# Patient Record
Sex: Female | Born: 1975 | Race: Black or African American | Hispanic: No | Marital: Single | Smoking: Never smoker
Health system: Southern US, Community
[De-identification: ages and names within clinical notes are randomized; demographics above are authoritative.]

---

## 1999-07-18 ENCOUNTER — Emergency Department (HOSPITAL_COMMUNITY): Admission: EM | Admit: 1999-07-18 | Discharge: 1999-07-18 | Payer: Self-pay | Admitting: Emergency Medicine

## 2005-01-31 ENCOUNTER — Emergency Department (HOSPITAL_COMMUNITY): Admission: EM | Admit: 2005-01-31 | Discharge: 2005-01-31 | Payer: Self-pay | Admitting: Family Medicine

## 2005-02-17 ENCOUNTER — Emergency Department (HOSPITAL_COMMUNITY): Admission: EM | Admit: 2005-02-17 | Discharge: 2005-02-17 | Payer: Self-pay | Admitting: Family Medicine

## 2005-06-08 ENCOUNTER — Ambulatory Visit: Payer: Self-pay | Admitting: Nurse Practitioner

## 2005-06-22 ENCOUNTER — Ambulatory Visit: Payer: Self-pay | Admitting: Nurse Practitioner

## 2005-06-29 ENCOUNTER — Ambulatory Visit: Payer: Self-pay | Admitting: Nurse Practitioner

## 2005-08-17 ENCOUNTER — Ambulatory Visit: Payer: Self-pay | Admitting: Nurse Practitioner

## 2005-09-05 ENCOUNTER — Ambulatory Visit: Payer: Self-pay | Admitting: Nurse Practitioner

## 2005-11-11 ENCOUNTER — Emergency Department (HOSPITAL_COMMUNITY): Admission: EM | Admit: 2005-11-11 | Discharge: 2005-11-11 | Payer: Self-pay | Admitting: Family Medicine

## 2005-11-21 ENCOUNTER — Emergency Department (HOSPITAL_COMMUNITY): Admission: EM | Admit: 2005-11-21 | Discharge: 2005-11-21 | Payer: Self-pay | Admitting: Family Medicine

## 2005-12-18 ENCOUNTER — Emergency Department (HOSPITAL_COMMUNITY): Admission: EM | Admit: 2005-12-18 | Discharge: 2005-12-18 | Payer: Self-pay | Admitting: Family Medicine

## 2006-07-04 ENCOUNTER — Ambulatory Visit: Payer: Self-pay | Admitting: Family Medicine

## 2006-07-19 ENCOUNTER — Inpatient Hospital Stay (HOSPITAL_COMMUNITY): Admission: AD | Admit: 2006-07-19 | Discharge: 2006-07-19 | Payer: Self-pay | Admitting: Gynecology

## 2006-07-23 ENCOUNTER — Inpatient Hospital Stay (HOSPITAL_COMMUNITY): Admission: AD | Admit: 2006-07-23 | Discharge: 2006-07-23 | Payer: Self-pay | Admitting: Gynecology

## 2006-09-20 ENCOUNTER — Inpatient Hospital Stay (HOSPITAL_COMMUNITY): Admission: AD | Admit: 2006-09-20 | Discharge: 2006-09-20 | Payer: Self-pay | Admitting: Obstetrics and Gynecology

## 2006-09-27 ENCOUNTER — Ambulatory Visit (HOSPITAL_COMMUNITY): Admission: RE | Admit: 2006-09-27 | Discharge: 2006-09-27 | Payer: Self-pay | Admitting: Unknown Physician Specialty

## 2006-12-06 ENCOUNTER — Ambulatory Visit (HOSPITAL_COMMUNITY): Admission: RE | Admit: 2006-12-06 | Discharge: 2006-12-06 | Payer: Self-pay | Admitting: Obstetrics & Gynecology

## 2007-01-24 ENCOUNTER — Ambulatory Visit (HOSPITAL_COMMUNITY): Admission: RE | Admit: 2007-01-24 | Discharge: 2007-01-24 | Payer: Self-pay | Admitting: Obstetrics and Gynecology

## 2007-02-08 ENCOUNTER — Encounter: Payer: Self-pay | Admitting: Obstetrics & Gynecology

## 2007-02-08 ENCOUNTER — Other Ambulatory Visit: Payer: Self-pay | Admitting: Obstetrics & Gynecology

## 2007-02-08 ENCOUNTER — Ambulatory Visit: Payer: Self-pay | Admitting: Obstetrics & Gynecology

## 2007-02-08 ENCOUNTER — Inpatient Hospital Stay (HOSPITAL_COMMUNITY): Admission: RE | Admit: 2007-02-08 | Discharge: 2007-02-11 | Payer: Self-pay | Admitting: Obstetrics & Gynecology

## 2008-01-06 ENCOUNTER — Emergency Department (HOSPITAL_COMMUNITY): Admission: EM | Admit: 2008-01-06 | Discharge: 2008-01-07 | Payer: Self-pay | Admitting: Emergency Medicine

## 2010-10-12 ENCOUNTER — Emergency Department (HOSPITAL_COMMUNITY)
Admission: EM | Admit: 2010-10-12 | Discharge: 2010-10-13 | Disposition: A | Discharge status: Home / Self Care | Payer: No Typology Code available for payment source | Attending: Emergency Medicine | Admitting: Emergency Medicine

## 2010-10-12 DIAGNOSIS — Z79899 Other long term (current) drug therapy: Secondary | ICD-10-CM | POA: Insufficient documentation

## 2010-10-12 DIAGNOSIS — R51 Headache: Secondary | ICD-10-CM | POA: Insufficient documentation

## 2010-10-12 DIAGNOSIS — M549 Dorsalgia, unspecified: Secondary | ICD-10-CM | POA: Insufficient documentation

## 2010-10-12 NOTE — Discharge Summary (Signed)
Holly Stanton, Holly Stanton               ACCOUNT NO.:  [id number]   MEDICAL RECORD NO.:  [id number]          PATIENT TYPE:  INP   LOCATION:  9103                          FACILITY:  [facility]   PHYSICIAN:  Allie Bossier, MD        DATE OF BIRTH:  1976-03-03   DATE OF ADMISSION:  02/08/2007  DATE OF DISCHARGE:  02/11/2007                               DISCHARGE SUMMARY   ADMISSION DIAGNOSES:  1. Intrauterine pregnancy at 37 weeks.  2. Vasa previa.   DISCHARGE DIAGNOSES:  1. Postoperative day #3 diagnosed repeat low transverse cesarean      section secondary to vasa previa with delivery of viable female      infant.  2. Acute blood loss anemia.   DISCHARGE MEDICATIONS:  1. Prenatal vitamins 1 p.o. daily while breast-feeding.  2. Iron sulfate 325 mg 1 tab p.o. daily.  3. Colace 100 mg 1 tab p.o. b.i.d. for constipation.  4. Percocet 5/325 mg 1-2 tabs p.o. q.4-6h. p.r.n. pain.  5. Micronor 0.35 mg 1 tab p.o. daily at the same time every day.   PROCEDURE:  Repeat low transverse cesarean section by Dr. Elsie Lincoln  on February 08, 2007.   PRENATAL LABS:  Blood type A positive, antibody negative, RPR  nonreactive, rubella immune, hepatitis B surface antigen negative, HIV  nonreactive, GBS unknown.   ALLERGIES:  AMPICILLIN CAUSES RASH AND CIPROFLOXACIN WHICH CAUSES A  RASH.   HOSPITAL COURSE:  The patient is a 34 year old G7, P3-1-3-4 who  presented at 37 weeks for a scheduled cesarean section repeat secondary  to vasa previa.  The surgery was conducted by Dr. Elsie Lincoln on  February 08, 2007.  A viable female infant was delivered.  There was  some mild uterine atony which resolved with Methergine, carboprost and  Cytotec.  The patient did have some postoperative anemia with a  hemoglobin of 7.9 on September 12 and 8.0 on September 13.  Otherwise,  the patient's postoperative period was uneventful.  She had an estimated  blood loss of 1200 mL.  On day of discharge, the patient was  without  complaint with some appropriate abdominal tenderness which was  controlled with Percocet.  The patient plans to breast and bottle-feed,  and the patient is using Micronor for birth control.  On postoperative  day #3, the patient was deemed stable for discharge.  Her staples were  to be removed by nursing prior to discharge.   DISCHARGE INSTRUCTIONS:  1. The patient is to follow up at the [facility] in 6 weeks.  2. The patient is to have nothing per vagina x6 weeks.  3. The patient not to do any heavy lifting x6 weeks.  4. The patient is to take all medications as prescribed.   FOLLOWUP ISSUES:  Anemia.   DISPOSITION:  The patient is discharged to home in stable condition.      Lauro Franklin, MD      Allie Bossier, MD  Electronically Signed    TCB/MEDQ  D:  02/11/2007  T:  02/11/2007  Job:  [id number]

## 2010-10-12 NOTE — Op Note (Signed)
Holly Stanton, Holly Stanton               ACCOUNT NO.:  [id number]   MEDICAL RECORD NO.:  [id number]          PATIENT TYPE:  INP   LOCATION:  9103                          FACILITY:  [facility]   PHYSICIAN:  Lesly Dukes, M.D. DATE OF BIRTH:  September 10, 1975   DATE OF PROCEDURE:  02/08/2007  DATE OF DISCHARGE:                               OPERATIVE REPORT   PREOPERATIVE DIAGNOSIS:  A 35 year old G7, para 2-1-3-4 at 37 weeks with  marginal previa.   POSTOPERATIVE DIAGNOSIS:  A 35 year old G7, para 2-1-3-4 at 37 weeks  with marginal previa.   OPERATION PERFORMED:  Repeat low transverse cesarean section.   SURGEON:  Lesly Dukes, M.D.   ASSISTANT:  Ginger Carne, MD   ANESTHESIA:  Spinal.   FINDINGS:  Viable female infant.  Mild uterine atony, resolved with  Methergine, Carboprost and Cytotec.   ESTIMATED BLOOD LOSS:  1200.   SPECIMENS:  Placenta to pathology.   COMPLICATIONS:  None.   DESCRIPTION OF PROCEDURE:  After informed consent was obtained, the  patient was taken to the operating room where spinal anesthesia was  found to be adequate.  The patient was placed in dorsal supine position  with a leftward tilt and prepped and draped in the normal sterile  fashion.  A Foley catheter was in the bladder.  Pfannenstiel skin  incision was made with the scalpel and carried down to fascia.  The  fascia was incised in the midline extended bilaterally.  The superior  and inferior aspects of the fascial incision were grasped with Kocher  clamps, tented up, and dissected off sharply and bluntly from underlying  layers of rectus muscle.  The rectus muscles were separated in the  midline.  The peritoneum was entered sharply.  This incision was  extended both superior and inferiorly with good visualization of the  bladder.  The bladder blade was inserted.  The bladder flap was created  and the bladder blade was then reinserted.  The uterus was incised in a  transverse fashion in the lower  uterine segment and extended bilaterally  bluntly.  The baby was breech and delivered easily.  The nose and mouth  were suctioned and the cord clamped and cut and the baby was handed off  to the waiting pediatrician.  The placenta delivered manually.  There  was atony.  Double strength Pitocin was given, Methergine was given IM,  Carboprost was given IM and 1000 mcg of Cytotec was given per rectum.  The uterine incision was closed with 0 Monocryl in a running locked  fashion.  A second suture of 0 Monocryl was used to reinforce the  incision and aid hemostasis.  The uterus was contracted well at the end  of uterine closure.  There were copious filmy adhesions on the back of  the uterus between the fallopian tubes, omentum and uterus.  These were  taken down.  The uterus again was noted to be hemostatic off tension.  The peritoneum, bladder reflection and rectus muscles were noted to be  hemostatic.  Fascia was closed with 0 Vicryl in a  running  fashion.  Good hemostasis was noted.  The subcutaneous tissue  was copiously irrigated and found to be hemostatic.  The skin was closed  with staples.  The patient tolerated the procedure well.  Sponge, lap,  needle and instruments were correct times two.  Patient went to recovery  room in stable condition.      Lesly Dukes, M.D.  Electronically Signed     KHL/MEDQ  D:  02/08/2007  T:  02/09/2007  Job:  [id number]

## 2011-03-10 ENCOUNTER — Other Ambulatory Visit: Payer: Self-pay

## 2011-03-11 LAB — CBC
HCT: 23 — ABNORMAL LOW
HCT: 23.1 — ABNORMAL LOW
HCT: 29.6 — ABNORMAL LOW
MCHC: 34
MCHC: 34.7
MCV: 94.4
Platelets: 250
Platelets: 261
RBC: 2.49 — ABNORMAL LOW
RDW: 13.4
RDW: 14
WBC: 10.3
WBC: 11.2 — ABNORMAL HIGH

## 2011-03-11 LAB — CROSSMATCH: Antibody Screen: NEGATIVE

## 2012-03-23 ENCOUNTER — Other Ambulatory Visit: Payer: Self-pay | Admitting: Internal Medicine

## 2014-04-01 ENCOUNTER — Other Ambulatory Visit: Payer: Self-pay

## 2014-04-01 DIAGNOSIS — Z1231 Encounter for screening mammogram for malignant neoplasm of breast: Secondary | ICD-10-CM

## 2014-04-07 ENCOUNTER — Ambulatory Visit
Admission: RE | Admit: 2014-04-07 | Discharge: 2014-04-07 | Disposition: A | Discharge status: Home / Self Care | Payer: BC Managed Care – PPO | Source: Ambulatory Visit

## 2014-04-07 DIAGNOSIS — Z1231 Encounter for screening mammogram for malignant neoplasm of breast: Secondary | ICD-10-CM

## 2014-10-31 ENCOUNTER — Ambulatory Visit
Admission: RE | Admit: 2014-10-31 | Discharge: 2014-10-31 | Disposition: A | Discharge status: Home / Self Care | Payer: BLUE CROSS/BLUE SHIELD | Source: Ambulatory Visit | Attending: Family | Admitting: Family

## 2014-10-31 ENCOUNTER — Other Ambulatory Visit: Payer: Self-pay | Admitting: Family

## 2014-10-31 DIAGNOSIS — M25562 Pain in left knee: Secondary | ICD-10-CM

## 2015-04-08 ENCOUNTER — Other Ambulatory Visit: Payer: Self-pay

## 2015-04-13 LAB — CYTOLOGY - PAP

## 2015-05-12 ENCOUNTER — Other Ambulatory Visit: Payer: Self-pay

## 2015-05-12 DIAGNOSIS — Z1231 Encounter for screening mammogram for malignant neoplasm of breast: Secondary | ICD-10-CM

## 2015-06-03 ENCOUNTER — Ambulatory Visit
Admission: RE | Admit: 2015-06-03 | Discharge: 2015-06-03 | Disposition: A | Discharge status: Home / Self Care | Payer: BLUE CROSS/BLUE SHIELD | Source: Ambulatory Visit

## 2015-06-03 DIAGNOSIS — Z1231 Encounter for screening mammogram for malignant neoplasm of breast: Secondary | ICD-10-CM

## 2017-04-12 ENCOUNTER — Other Ambulatory Visit: Payer: Self-pay | Admitting: Nurse Practitioner

## 2017-04-12 DIAGNOSIS — Z1231 Encounter for screening mammogram for malignant neoplasm of breast: Secondary | ICD-10-CM

## 2017-05-12 ENCOUNTER — Ambulatory Visit
Admission: RE | Admit: 2017-05-12 | Discharge: 2017-05-12 | Disposition: A | Discharge status: Home / Self Care | Payer: BLUE CROSS/BLUE SHIELD | Source: Ambulatory Visit | Attending: Nurse Practitioner | Admitting: Nurse Practitioner

## 2017-05-12 DIAGNOSIS — Z1231 Encounter for screening mammogram for malignant neoplasm of breast: Secondary | ICD-10-CM

## 2018-04-06 ENCOUNTER — Other Ambulatory Visit: Payer: Self-pay | Admitting: Nurse Practitioner

## 2018-04-06 DIAGNOSIS — Z1231 Encounter for screening mammogram for malignant neoplasm of breast: Secondary | ICD-10-CM

## 2018-04-25 ENCOUNTER — Encounter: Payer: Self-pay | Admitting: Nurse Practitioner

## 2018-04-25 ENCOUNTER — Ambulatory Visit (INDEPENDENT_AMBULATORY_CARE_PROVIDER_SITE_OTHER): Payer: Managed Care, Other (non HMO) | Admitting: Nurse Practitioner

## 2018-04-25 ENCOUNTER — Other Ambulatory Visit (HOSPITAL_COMMUNITY)
Admission: RE | Admit: 2018-04-25 | Discharge: 2018-04-25 | Disposition: A | Discharge status: Home / Self Care | Payer: Managed Care, Other (non HMO) | Source: Ambulatory Visit | Attending: Nurse Practitioner | Admitting: Nurse Practitioner

## 2018-04-25 VITALS — BP 122/84 | HR 67 | Temp 98.3°F | Ht 61.0 in | Wt 155.2 lb

## 2018-04-25 DIAGNOSIS — Z3041 Encounter for surveillance of contraceptive pills: Secondary | ICD-10-CM

## 2018-04-25 DIAGNOSIS — Z113 Encounter for screening for infections with a predominantly sexual mode of transmission: Secondary | ICD-10-CM | POA: Diagnosis present

## 2018-04-25 DIAGNOSIS — Z0000 Encounter for general adult medical examination without abnormal findings: Secondary | ICD-10-CM | POA: Diagnosis not present

## 2018-04-25 DIAGNOSIS — Z124 Encounter for screening for malignant neoplasm of cervix: Secondary | ICD-10-CM | POA: Diagnosis not present

## 2018-04-25 DIAGNOSIS — R319 Hematuria, unspecified: Secondary | ICD-10-CM | POA: Diagnosis not present

## 2018-04-25 DIAGNOSIS — N898 Other specified noninflammatory disorders of vagina: Secondary | ICD-10-CM

## 2018-04-25 LAB — POCT URINALYSIS DIPSTICK
Glucose, UA: NEGATIVE
Nitrite, UA: NEGATIVE
Protein, UA: POSITIVE — AB
Spec Grav, UA: 1.025 (ref 1.010–1.025)
Urobilinogen, UA: 0.2 U/dL
pH, UA: 6 (ref 5.0–8.0)

## 2018-04-25 MED ORDER — NORGESTIMATE-ETH ESTRADIOL 0.25-35 MG-MCG PO TABS: 1.0000 | ORAL_TABLET | Freq: Every day | ORAL | 3 refills | Status: DC

## 2018-04-25 MED ORDER — METRONIDAZOLE 0.75 % VA GEL: 1.0000 | Freq: Two times a day (BID) | VAGINAL | 0 refills | Status: DC

## 2018-04-25 NOTE — Progress Notes (Signed)
Subjective:     Patient ID: Holly Stanton , female    DOB: 06-07-1975 , 42 y.o.   MRN: [id number]   Chief Complaint  Patient presents with  . Annual Exam   The patient states she uses OCP (estrogen/progesterone) for birth control. Last LMP was Patient's last menstrual period was 04/04/2018.. Negative for Dysmenorrhea and Negative for Menorrhagia Mammogram last done 05/12/17.  Negative for: breast discharge, breast lump(s), breast pain and breast self exam.  Pertinent negatives include abnormal bleeding (hematology), anxiety, decreased libido, depression, difficulty falling sleep, dyspareunia, history of infertility, nocturia, sexual dysfunction, sleep disturbances, urinary incontinence, urinary urgency, vaginal discharge and vaginal itching. Diet regular.The patient states her exercise level is  5 days, cardio and strength training   . The patient's tobacco use is:  Social History   Tobacco Use  Smoking Status Never Smoker  Smokeless Tobacco Never Used  . She has been exposed to passive smoke. The patient's alcohol use is:  Social History   Substance and Sexual Activity  Alcohol Use Yes   Comment: OCCASSIONALY  . Additional information: Last pap 04/19/17 , next one scheduled for patient requests today.   HPI  Here for health maintenance     No past medical history on file.   Family History  Problem Relation Age of Onset  . Breast cancer Neg Hx      Current Outpatient Medications:  .  norgestimate-ethinyl estradiol (ORTHO-CYCLEN,SPRINTEC,PREVIFEM) 0.25-35 MG-MCG tablet, Take 1 tablet by mouth daily., Disp: , Rfl:    Allergies  Allergen Reactions  . Ampicillin Rash  . Ciprofloxacin Hcl Rash     Review of Systems  Constitutional: Negative.   HENT: Negative.   Eyes: Negative.   Respiratory: Negative.   Cardiovascular: Negative.   Gastrointestinal: Negative.   Endocrine: Negative.   Genitourinary: Negative.   Musculoskeletal: Negative.   Skin: Negative.    Allergic/Immunologic: Negative.   Neurological: Negative.   Hematological: Negative.   Psychiatric/Behavioral: Negative.      Today's Vitals   04/25/18 0918  BP: 122/84  Pulse: 67  Temp: 98.3 F (36.8 C)  TempSrc: Oral  SpO2: 98%  Weight: 155 lb 3.2 oz (70.4 kg)  Height: 5\' 1"  (1.549 m)  PainSc: 0-No pain   Body mass index is 29.32 kg/m.   Objective:  Physical Exam  Constitutional: She is oriented to person, place, and time. Vital signs are normal. She appears well-developed and well-nourished.  HENT:  Head: Normocephalic and atraumatic.  Right Ear: Hearing, tympanic membrane, external ear and ear canal normal.  Left Ear: Hearing, tympanic membrane, external ear and ear canal normal.  Nose: Nose normal.  Mouth/Throat: Oropharynx is clear and moist.  Eyes: Pupils are equal, round, and reactive to light. Conjunctivae, EOM and lids are normal.  Fundoscopic exam:      The right eye shows no papilledema.       The left eye shows no papilledema.  Neck: Normal range of motion and full passive range of motion without pain. Neck supple. Carotid bruit is not present. No thyroid mass present.  Cardiovascular: Normal rate, regular rhythm, normal heart sounds, intact distal pulses and normal pulses.  No murmur heard. Pulmonary/Chest: Effort normal and breath sounds normal.  Abdominal: Soft. Bowel sounds are normal.  Musculoskeletal: Normal range of motion.  Neurological: She is alert and oriented to person, place, and time. She has normal strength. No cranial nerve deficit or sensory deficit.  Skin: Skin is warm and dry. Capillary refill takes less  than 2 seconds.  Psychiatric: She has a normal mood and affect. Her behavior is normal. Judgment and thought content normal.  Vitals reviewed.       Assessment And Plan:     1. Encounter for general adult medical examination w/o abnormal findings . Behavior modifications discussed and diet history reviewed. . Pt will continue to  exercise regularly and modify diet with low GI, plant based foods and decrease intake of processed foods.  . Recommend intake of daily multivitamin, Vitamin D, and calcium.  . Recommend mammogram and colonoscopy for preventive screenings, as well as recommend immunizations that include influenza, TDAP - POCT Urinalysis Dipstick (81002) - CBC - CMP14 + Anion Gap - Hemoglobin A1c - Lipid panel - Vitamin D (25 hydroxy)  2. Encounter for birth control pills maintenance  - norgestimate-ethinyl estradiol (ORTHO-CYCLEN,SPRINTEC,PREVIFEM) 0.25-35 MG-MCG tablet; Take 1 tablet by mouth daily.  Dispense: 3 Package; Refill: 3  3. Encounter for Papanicolaou smear of cervix   4. Encounter for screening examination for sexually transmitted disease  White discharge noted on pelvic exam - Cytology -Pap Smear - Cytology (oral, anal, urethral) ancillary only - HIV antibody (with reflex) - T pallidum Screening [place]  5. Hematuria, unspecified type  Will have her to recheck urine in a few weeks, encouraged to increase her water intake.  - Culture, Urine  6. Vaginal discharge  White discharge noted, will treat in the event this is bacterial vaginosis, will also send a swab for STD.   - metroNIDAZOLE (METROGEL VAGINAL) 0.75 % vaginal gel; Place 1 Applicatorful vaginally 2 (two) times daily.  Dispense: 70 g; Refill: 0   Arnette Felts, FNP

## 2018-04-25 NOTE — Patient Instructions (Signed)
Hematuria, Adult Hematuria is blood in your urine. It can be caused by a bladder infection, kidney infection, prostate infection, kidney stone, or cancer of your urinary tract. Infections can usually be treated with medicine, and a kidney stone usually will pass through your urine. If neither of these is the cause of your hematuria, further workup to find out the reason may be needed. It is very important that you tell your health care provider about any blood you see in your urine, even if the blood stops without treatment or happens without causing pain. Blood in your urine that happens and then stops and then happens again can be a symptom of a very serious condition. Also, pain is not a symptom in the initial stages of many urinary cancers. Follow these instructions at home:  Drink lots of fluid, 3-4 quarts a day. If you have been diagnosed with an infection, cranberry juice is especially recommended, in addition to large amounts of water.  Avoid caffeine, tea, and carbonated beverages because they tend to irritate the bladder.  Avoid alcohol because it may irritate the prostate.  Take all medicines as directed by your health care provider.  If you were prescribed an antibiotic medicine, finish it all even if you start to feel better.  If you have been diagnosed with a kidney stone, follow your health care provider's instructions regarding straining your urine to catch the stone.  Empty your bladder often. Avoid holding urine for long periods of time.  After a bowel movement, women should cleanse front to back. Use each tissue only once.  Empty your bladder before and after sexual intercourse if you are a female. Contact a health care provider if:  You develop back pain.  You have a fever.  You have a feeling of sickness in your stomach (nausea) or vomiting.  Your symptoms are not better in 3 days. Return sooner if you are getting worse. Get help right away if:  You develop  severe vomiting and are unable to keep the medicine down.  You develop severe back or abdominal pain despite taking your medicines.  You begin passing a large amount of blood or clots in your urine.  You feel extremely weak or faint, or you pass out. This information is not intended to replace advice given to you by your health care provider. Make sure you discuss any questions you have with your health care provider. Document Released: 05/16/2005 Document Revised: 10/22/2015 Document Reviewed: 01/14/2013 Elsevier Interactive Patient Education  2017 [place] Maintenance, Female Adopting a healthy lifestyle and getting preventive care can go a long way to promote health and wellness. Talk with your health care provider about what schedule of regular examinations is right for you. This is a good chance for you to check in with your provider about disease prevention and staying healthy. In between checkups, there are plenty of things you can do on your own. Experts have done a lot of research about which lifestyle changes and preventive measures are most likely to keep you healthy. Ask your health care provider for more information. Weight and diet Eat a healthy diet  Be sure to include plenty of vegetables, fruits, low-fat dairy products, and lean protein.  Do not eat a lot of foods high in solid fats, added sugars, or salt.  Get regular exercise. This is one of the most important things you can do for your health. ? Most adults should exercise for at least 150 minutes each week. The  exercise should increase your heart rate and make you sweat (moderate-intensity exercise). ? Most adults should also do strengthening exercises at least twice a week. This is in addition to the moderate-intensity exercise.  Maintain a healthy weight  Body mass index (BMI) is a measurement that can be used to identify possible weight problems. It estimates body fat based on height and weight. Your  health care provider can help determine your BMI and help you achieve or maintain a healthy weight.  For females 95 years of age and older: ? A BMI below 18.5 is considered underweight. ? A BMI of 18.5 to 24.9 is normal. ? A BMI of 25 to 29.9 is considered overweight. ? A BMI of 30 and above is considered obese.  Watch levels of cholesterol and blood lipids  You should start having your blood tested for lipids and cholesterol at 44 years of age, then have this test every 5 years.  You may need to have your cholesterol levels checked more often if: ? Your lipid or cholesterol levels are high. ? You are older than 44 years of age. ? You are at high risk for heart disease.  Cancer screening Lung Cancer  Lung cancer screening is recommended for adults 53-60 years old who are at high risk for lung cancer because of a history of smoking.  A yearly low-dose CT scan of the lungs is recommended for people who: ? Currently smoke. ? Have quit within the past 15 years. ? Have at least a 30-pack-year history of smoking. A pack year is smoking an average of one pack of cigarettes a day for 1 year.  Yearly screening should continue until it has been 15 years since you quit.  Yearly screening should stop if you develop a health problem that would prevent you from having lung cancer treatment.  Breast Cancer  Practice breast self-awareness. This means understanding how your breasts normally appear and feel.  It also means doing regular breast self-exams. Let your health care provider know about any changes, no matter how small.  If you are in your 20s or 30s, you should have a clinical breast exam (CBE) by a health care provider every 1-3 years as part of a regular health exam.  If you are 82 or older, have a CBE every year. Also consider having a breast X-ray (mammogram) every year.  If you have a family history of breast cancer, talk to your health care provider about genetic  screening.  If you are at high risk for breast cancer, talk to your health care provider about having an MRI and a mammogram every year.  Breast cancer gene (BRCA) assessment is recommended for women who have family members with BRCA-related cancers. BRCA-related cancers include: ? Breast. ? Ovarian. ? Tubal. ? Peritoneal cancers.  Results of the assessment will determine the need for genetic counseling and BRCA1 and BRCA2 testing.  Cervical Cancer Your health care provider may recommend that you be screened regularly for cancer of the pelvic organs (ovaries, uterus, and vagina). This screening involves a pelvic examination, including checking for microscopic changes to the surface of your cervix (Pap test). You may be encouraged to have this screening done every 3 years, beginning at age 73.  For women ages 22-65, health care providers may recommend pelvic exams and Pap testing every 3 years, or they may recommend the Pap and pelvic exam, combined with testing for human papilloma virus (HPV), every 5 years. Some types of HPV increase your  risk of cervical cancer. Testing for HPV may also be done on women of any age with unclear Pap test results.  Other health care providers may not recommend any screening for nonpregnant women who are considered low risk for pelvic cancer and who do not have symptoms. Ask your health care provider if a screening pelvic exam is right for you.  If you have had past treatment for cervical cancer or a condition that could lead to cancer, you need Pap tests and screening for cancer for at least 20 years after your treatment. If Pap tests have been discontinued, your risk factors (such as having a new sexual partner) need to be reassessed to determine if screening should resume. Some women have medical problems that increase the chance of getting cervical cancer. In these cases, your health care provider may recommend more frequent screening and Pap  tests.  Colorectal Cancer  This type of cancer can be detected and often prevented.  Routine colorectal cancer screening usually begins at 44 years of age and continues through 44 years of age.  Your health care provider may recommend screening at an earlier age if you have risk factors for colon cancer.  Your health care provider may also recommend using home test kits to check for hidden blood in the stool.  A small camera at the end of a tube can be used to examine your colon directly (sigmoidoscopy or colonoscopy). This is done to check for the earliest forms of colorectal cancer.  Routine screening usually begins at age 2.  Direct examination of the colon should be repeated every 5-10 years through 44 years of age. However, you may need to be screened more often if early forms of precancerous polyps or small growths are found.  Skin Cancer  Check your skin from head to toe regularly.  Tell your health care provider about any new moles or changes in moles, especially if there is a change in a mole's shape or color.  Also tell your health care provider if you have a mole that is larger than the size of a pencil eraser.  Always use sunscreen. Apply sunscreen liberally and repeatedly throughout the day.  Protect yourself by wearing long sleeves, pants, a wide-brimmed hat, and sunglasses whenever you are outside.  Heart disease, diabetes, and high blood pressure  High blood pressure causes heart disease and increases the risk of stroke. High blood pressure is more likely to develop in: ? People who have blood pressure in the high end of the normal range (130-139/85-89 mm Hg). ? People who are overweight or obese. ? People who are African American.  If you are 32-46 years of age, have your blood pressure checked every 3-5 years. If you are 7 years of age or older, have your blood pressure checked every year. You should have your blood pressure measured twice-once when you are at  a hospital or clinic, and once when you are not at a hospital or clinic. Record the average of the two measurements. To check your blood pressure when you are not at a hospital or clinic, you can use: ? An automated blood pressure machine at a pharmacy. ? A home blood pressure monitor.  If you are between 41 years and 20 years old, ask your health care provider if you should take aspirin to prevent strokes.  Have regular diabetes screenings. This involves taking a blood sample to check your fasting blood sugar level. ? If you are at a normal weight  and have a low risk for diabetes, have this test once every three years after 44 years of age. ? If you are overweight and have a high risk for diabetes, consider being tested at a younger age or more often. Preventing infection Hepatitis B  If you have a higher risk for hepatitis B, you should be screened for this virus. You are considered at high risk for hepatitis B if: ? You were born in a country where hepatitis B is common. Ask your health care provider which countries are considered high risk. ? Your parents were born in a high-risk country, and you have not been immunized against hepatitis B (hepatitis B vaccine). ? You have HIV or AIDS. ? You use needles to inject street drugs. ? You live with someone who has hepatitis B. ? You have had sex with someone who has hepatitis B. ? You get hemodialysis treatment. ? You take certain medicines for conditions, including cancer, organ transplantation, and autoimmune conditions.  Hepatitis C  Blood testing is recommended for: ? Everyone born from [birth year] through 1967. ? Anyone with known risk factors for hepatitis C.  Sexually transmitted infections (STIs)  You should be screened for sexually transmitted infections (STIs) including gonorrhea and chlamydia if: ? You are sexually active and are younger than 44 years of age. ? You are older than 43 years of age and your health care provider tells  you that you are at risk for this type of infection. ? Your sexual activity has changed since you were last screened and you are at an increased risk for chlamydia or gonorrhea. Ask your health care provider if you are at risk.  If you do not have HIV, but are at risk, it may be recommended that you take a prescription medicine daily to prevent HIV infection. This is called pre-exposure prophylaxis (PrEP). You are considered at risk if: ? You are sexually active and do not regularly use condoms or know the HIV status of your partner(s). ? You take drugs by injection. ? You are sexually active with a partner who has HIV.  Talk with your health care provider about whether you are at high risk of being infected with HIV. If you choose to begin PrEP, you should first be tested for HIV. You should then be tested every 3 months for as long as you are taking PrEP. Pregnancy  If you are premenopausal and you may become pregnant, ask your health care provider about preconception counseling.  If you may become pregnant, take 400 to 800 micrograms (mcg) of folic acid every day.  If you want to prevent pregnancy, talk to your health care provider about birth control (contraception). Osteoporosis and menopause  Osteoporosis is a disease in which the bones lose minerals and strength with aging. This can result in serious bone fractures. Your risk for osteoporosis can be identified using a bone density scan.  If you are 26 years of age or older, or if you are at risk for osteoporosis and fractures, ask your health care provider if you should be screened.  Ask your health care provider whether you should take a calcium or vitamin D supplement to lower your risk for osteoporosis.  Menopause may have certain physical symptoms and risks.  Hormone replacement therapy may reduce some of these symptoms and risks. Talk to your health care provider about whether hormone replacement therapy is right for  you. Follow these instructions at home:  Schedule regular health, dental, and eye exams.  Stay current with your immunizations.  Do not use any tobacco products including cigarettes, chewing tobacco, or electronic cigarettes.  If you are pregnant, do not drink alcohol.  If you are breastfeeding, limit how much and how often you drink alcohol.  Limit alcohol intake to no more than 1 drink per day for nonpregnant women. One drink equals 12 ounces of beer, 5 ounces of wine, or 1 ounces of hard liquor.  Do not use street drugs.  Do not share needles.  Ask your health care provider for help if you need support or information about quitting drugs.  Tell your health care provider if you often feel depressed.  Tell your health care provider if you have ever been abused or do not feel safe at home. This information is not intended to replace advice given to you by your health care provider. Make sure you discuss any questions you have with your health care provider. Document Released: 11/29/2010 Document Revised: 10/22/2015 Document Reviewed: 02/17/2015 Elsevier Interactive Patient Education  [employer].

## 2018-04-26 LAB — T PALLIDUM SCREENING CASCADE: T PALLIDUM ANTIBODIES (TP-PA): NONREACTIVE

## 2018-04-26 LAB — CMP14 + ANION GAP
ALT: 23 IU/L (ref 0–32)
ANION GAP: 16 mmol/L (ref 10.0–18.0)
AST: 20 IU/L (ref 0–40)
Albumin/Globulin Ratio: 1.7 (ref 1.2–2.2)
Albumin: 4.7 g/dL (ref 3.5–5.5)
Alkaline Phosphatase: 74 IU/L (ref 39–117)
BUN/Creatinine Ratio: 12 (ref 9–23)
BUN: 10 mg/dL (ref 6–24)
Bilirubin Total: 0.3 mg/dL (ref 0.0–1.2)
CO2: 21 mmol/L (ref 20–29)
Calcium: 9.8 mg/dL (ref 8.7–10.2)
Chloride: 102 mmol/L (ref 96–106)
Creatinine, Ser: 0.82 mg/dL (ref 0.57–1.00)
GFR calc Af Amer: 101 mL/min/{1.73_m2} (ref >59)
GFR, EST NON AFRICAN AMERICAN: 88 mL/min/{1.73_m2} (ref >59)
GLOBULIN, TOTAL: 2.7 g/dL (ref 1.5–4.5)
Glucose: 86 mg/dL (ref 65–99)
Potassium: 4.5 mmol/L (ref 3.5–5.2)
SODIUM: 139 mmol/L (ref 134–144)
TOTAL PROTEIN: 7.4 g/dL (ref 6.0–8.5)

## 2018-04-26 LAB — LIPID PANEL
CHOLESTEROL TOTAL: 175 mg/dL (ref 100–199)
Chol/HDL Ratio: 2.8 ratio (ref 0.0–4.4)
HDL: 63 mg/dL (ref >39)
LDL Calculated: 88 mg/dL (ref 0–99)
Triglycerides: 122 mg/dL (ref 0–149)
VLDL CHOLESTEROL CAL: 24 mg/dL (ref 5–40)

## 2018-04-26 LAB — URINE CULTURE

## 2018-04-26 LAB — CBC
HEMATOCRIT: 36.9 % (ref 34.0–46.6)
Hemoglobin: 12.7 g/dL (ref 11.1–15.9)
MCH: 29.5 pg (ref 26.6–33.0)
MCHC: 34.4 g/dL (ref 31.5–35.7)
MCV: 86 fL (ref 79–97)
Platelets: 289 10*3/uL (ref 150–450)
RBC: 4.3 x10E6/uL (ref 3.77–5.28)
RDW: 13.1 % (ref 12.3–15.4)
WBC: 6.1 10*3/uL (ref 3.4–10.8)

## 2018-04-26 LAB — HEMOGLOBIN A1C
ESTIMATED AVERAGE GLUCOSE: 117 mg/dL
Hgb A1c MFr Bld: 5.7 % — ABNORMAL HIGH (ref 4.8–5.6)

## 2018-04-26 LAB — HIV ANTIBODY (ROUTINE TESTING W REFLEX): HIV Screen 4th Generation wRfx: NONREACTIVE

## 2018-04-26 LAB — VITAMIN D 25 HYDROXY (VIT D DEFICIENCY, FRACTURES): Vit D, 25-Hydroxy: 29.6 ng/mL — ABNORMAL LOW (ref 30.0–100.0)

## 2018-05-01 LAB — CYTOLOGY, (ORAL, ANAL, URETHRAL) ANCILLARY ONLY
CANDIDA VAGINITIS: NEGATIVE
CHLAMYDIA, DNA PROBE: NEGATIVE
Neisseria Gonorrhea: NEGATIVE
Trichomonas: NEGATIVE

## 2018-05-02 LAB — CYTOLOGY - PAP: Diagnosis: NEGATIVE

## 2018-05-16 ENCOUNTER — Ambulatory Visit
Admission: RE | Admit: 2018-05-16 | Discharge: 2018-05-16 | Disposition: A | Discharge status: Home / Self Care | Payer: Managed Care, Other (non HMO) | Source: Ambulatory Visit | Attending: Nurse Practitioner | Admitting: Nurse Practitioner

## 2018-05-16 DIAGNOSIS — Z1231 Encounter for screening mammogram for malignant neoplasm of breast: Secondary | ICD-10-CM

## 2019-03-11 ENCOUNTER — Other Ambulatory Visit: Payer: Self-pay | Admitting: Nurse Practitioner

## 2019-03-11 DIAGNOSIS — Z3041 Encounter for surveillance of contraceptive pills: Secondary | ICD-10-CM

## 2019-04-30 ENCOUNTER — Other Ambulatory Visit: Payer: Self-pay | Admitting: Nurse Practitioner

## 2019-04-30 DIAGNOSIS — Z1231 Encounter for screening mammogram for malignant neoplasm of breast: Secondary | ICD-10-CM

## 2019-05-01 ENCOUNTER — Ambulatory Visit (INDEPENDENT_AMBULATORY_CARE_PROVIDER_SITE_OTHER): Payer: Managed Care, Other (non HMO) | Admitting: Nurse Practitioner

## 2019-05-01 ENCOUNTER — Other Ambulatory Visit (HOSPITAL_COMMUNITY)
Admission: RE | Admit: 2019-05-01 | Discharge: 2019-05-01 | Disposition: A | Discharge status: Home / Self Care | Payer: Managed Care, Other (non HMO) | Source: Ambulatory Visit | Attending: Nurse Practitioner | Admitting: Nurse Practitioner

## 2019-05-01 ENCOUNTER — Encounter: Payer: Self-pay | Admitting: Nurse Practitioner

## 2019-05-01 ENCOUNTER — Other Ambulatory Visit: Payer: Self-pay

## 2019-05-01 VITALS — BP 112/80 | HR 62 | Temp 98.0°F | Ht 61.4 in | Wt 152.6 lb

## 2019-05-01 DIAGNOSIS — Z113 Encounter for screening for infections with a predominantly sexual mode of transmission: Secondary | ICD-10-CM | POA: Insufficient documentation

## 2019-05-01 DIAGNOSIS — Z0000 Encounter for general adult medical examination without abnormal findings: Secondary | ICD-10-CM | POA: Diagnosis not present

## 2019-05-01 DIAGNOSIS — Z3041 Encounter for surveillance of contraceptive pills: Secondary | ICD-10-CM

## 2019-05-01 DIAGNOSIS — Z124 Encounter for screening for malignant neoplasm of cervix: Secondary | ICD-10-CM

## 2019-05-01 DIAGNOSIS — E559 Vitamin D deficiency, unspecified: Secondary | ICD-10-CM

## 2019-05-01 LAB — POCT URINALYSIS DIPSTICK
Bilirubin, UA: NEGATIVE
Glucose, UA: NEGATIVE
Ketones, UA: NEGATIVE
Nitrite, UA: NEGATIVE
Protein, UA: NEGATIVE
Spec Grav, UA: 1.015 (ref 1.010–1.025)
Urobilinogen, UA: 0.2 E.U./dL
pH, UA: 6 (ref 5.0–8.0)

## 2019-05-01 MED ORDER — NORGESTIMATE-ETH ESTRADIOL 0.25-35 MG-MCG PO TABS: 1.0000 | ORAL_TABLET | Freq: Every day | ORAL | 3 refills | Status: DC

## 2019-05-01 NOTE — Progress Notes (Signed)
This visit occurred during the SARS-CoV-2 public health emergency.  Safety protocols were in place, including screening questions prior to the visit, additional usage of staff PPE, and extensive cleaning of exam room while observing appropriate contact time as indicated for disinfecting solutions.  Subjective:     Patient ID: Holly Stanton , female    DOB: 10-15-1975 , 43 y.o.   MRN: [id number]   Chief Complaint  Patient presents with  . Annual Exam    HPI  Here for HM   The patient states she uses OCP (estrogen/progesterone) for birth control. Last LMP was Patient's last menstrual period was 04/11/2019 (approximate).. Negative for Dysmenorrhea and Negative for Menorrhagia Mammogram last done 05/17/2018.  Negative for: breast discharge, breast lump(s), breast pain and breast self exam.  Pertinent negatives include abnormal bleeding (hematology), anxiety, decreased libido, depression, difficulty falling sleep, dyspareunia, history of infertility, nocturia, sexual dysfunction, sleep disturbances, urinary incontinence, urinary urgency, vaginal discharge and vaginal itching. Diet regular, portion control and cooking healthier in general.  The patient states her exercise level is moderate.     The patient's tobacco use is:  Social History   Tobacco Use  Smoking Status Never Smoker  Smokeless Tobacco Never Used   She has been exposed to passive smoke. The patient's alcohol use is:  Social History   Substance and Sexual Activity  Alcohol Use Yes   Comment: OCCASSIONALY   Additional information: Last pap 04/25/2018, next one scheduled for today at patient's request.    No past medical history on file.   Family History  Problem Relation Age of Onset  . Breast cancer Neg Hx      Current Outpatient Medications:  .  SPRINTEC 28 0.25-35 MG-MCG tablet, Take 1 tablet by mouth daily, Disp: 84 tablet, Rfl: 0   Allergies  Allergen Reactions  . Ampicillin Rash  . Ciprofloxacin Hcl  Rash     Review of Systems  Constitutional: Negative.   HENT: Negative.   Eyes: Negative.   Respiratory: Negative.   Cardiovascular: Negative.   Gastrointestinal: Negative.   Endocrine: Negative.   Genitourinary: Negative.   Musculoskeletal: Negative.   Skin: Negative.   Allergic/Immunologic: Negative.   Neurological: Negative.   Hematological: Negative.   Psychiatric/Behavioral: Negative.      Today's Vitals   05/01/19 0840  BP: 112/80  Pulse: 62  Temp: 98 F (36.7 C)  TempSrc: Oral  Weight: 152 lb 9.6 oz (69.2 kg)  Height: 5' 1.4" (1.56 m)  PainSc: 0-No pain   Body mass index is 28.46 kg/m.   Objective:  Physical Exam Vitals signs reviewed.  Constitutional:      Appearance: She is well-developed.  HENT:     Head: Normocephalic and atraumatic.     Right Ear: Hearing, tympanic membrane, ear canal and external ear normal. There is no impacted cerumen.     Left Ear: Hearing, tympanic membrane, ear canal and external ear normal. There is no impacted cerumen.     Mouth/Throat:     Mouth: Mucous membranes are moist.  Eyes:     General: Lids are normal.     Extraocular Movements: Extraocular movements intact.     Pupils: Pupils are equal, round, and reactive to light.     Funduscopic exam:    Right eye: No papilledema.        Left eye: No papilledema.  Neck:     Musculoskeletal: Full passive range of motion without pain, normal range of motion and neck supple.  Thyroid: No thyroid mass.     Vascular: No carotid bruit.  Cardiovascular:     Rate and Rhythm: Normal rate and regular rhythm.     Pulses: Normal pulses.     Heart sounds: Normal heart sounds. No murmur.  Pulmonary:     Effort: Pulmonary effort is normal. No respiratory distress.     Breath sounds: Normal breath sounds.  Abdominal:     General: Bowel sounds are normal.     Palpations: Abdomen is soft.  Genitourinary:    Labia:        Right: No tenderness.        Left: No tenderness.       Vagina: Normal.     Cervix: Discharge (thick white discharge on cervix) present.     Uterus: Normal.      Adnexa: Right adnexa normal and left adnexa normal.       Right: No mass or tenderness.         Left: No mass or tenderness.    Musculoskeletal: Normal range of motion.  Skin:    General: Skin is warm and dry.     Capillary Refill: Capillary refill takes less than 2 seconds.  Neurological:     General: No focal deficit present.     Mental Status: She is alert and oriented to person, place, and time.     Cranial Nerves: No cranial nerve deficit.     Sensory: No sensory deficit.  Psychiatric:        Mood and Affect: Mood normal.        Behavior: Behavior normal.        Thought Content: Thought content normal.        Judgment: Judgment normal.         Assessment And Plan:     1. Encounter for general adult medical examination w/o abnormal findings . Behavior modifications discussed and diet history reviewed.   . Pt will continue to exercise regularly and modify diet with low GI, plant based foods and decrease intake of processed foods.  . Recommend intake of daily multivitamin, Vitamin D, and calcium.  . Recommend mammogram and colonoscopy for preventive screenings, as well as recommend immunizations that include influenza, TDAP (up to date)  - POCT Urinalysis Dipstick (81002) - Hemoglobin A1c - Lipid Profile - BMP8+eGFR  2. Encounter for birth control pills maintenance  - norgestimate-ethinyl estradiol (SPRINTEC 28) 0.25-35 MG-MCG tablet; Take 1 tablet by mouth daily.  Dispense: 84 tablet; Refill: 3  3. Encounter for Papanicolaou smear of cervix  Pap done had thick white discharge to vaginal vault and around cervix  Sent for STD screening  4. Encounter for screening examination for sexually transmitted disease  - HIV antibody (with reflex) - Cytology -Pap Smear  5. Vitamin D deficiency  Will check vitamin D level and supplement as needed.     Also encouraged  to spend 15 minutes in the sun daily.  - Vitamin D (25 hydroxy)   Minette Brine, FNP    THE PATIENT IS ENCOURAGED TO PRACTICE SOCIAL DISTANCING DUE TO THE COVID-19 PANDEMIC.

## 2019-05-01 NOTE — Patient Instructions (Signed)
Health Maintenance  Topic Date Due  . INFLUENZA VACCINE  08/28/2019 (Originally 12/29/2018)  . PAP SMEAR-Modifier  04/25/2021  . TETANUS/TDAP  05/02/2021  . HIV Screening  Completed   Health Maintenance, Female Adopting a healthy lifestyle and getting preventive care are important in promoting health and wellness. Ask your health care provider about:  The right schedule for you to have regular tests and exams.  Things you can do on your own to prevent diseases and keep yourself healthy. What should I know about diet, weight, and exercise? Eat a healthy diet   Eat a diet that includes plenty of vegetables, fruits, low-fat dairy products, and lean protein.  Do not eat a lot of foods that are high in solid fats, added sugars, or sodium. Maintain a healthy weight Body mass index (BMI) is used to identify weight problems. It estimates body fat based on height and weight. Your health care provider can help determine your BMI and help you achieve or maintain a healthy weight. Get regular exercise Get regular exercise. This is one of the most important things you can do for your health. Most adults should:  Exercise for at least 150 minutes each week. The exercise should increase your heart rate and make you sweat (moderate-intensity exercise).  Do strengthening exercises at least twice a week. This is in addition to the moderate-intensity exercise.  Spend less time sitting. Even light physical activity can be beneficial. Watch cholesterol and blood lipids Have your blood tested for lipids and cholesterol at 45 years of age, then have this test every 5 years. Have your cholesterol levels checked more often if:  Your lipid or cholesterol levels are high.  You are older than 45 years of age.  You are at high risk for heart disease. What should I know about cancer screening? Depending on your health history and family history, you may need to have cancer screening at various ages. This may  include screening for:  Breast cancer.  Cervical cancer.  Colorectal cancer.  Skin cancer.  Lung cancer. What should I know about heart disease, diabetes, and high blood pressure? Blood pressure and heart disease  High blood pressure causes heart disease and increases the risk of stroke. This is more likely to develop in people who have high blood pressure readings, are of African descent, or are overweight.  Have your blood pressure checked: ? Every 3-5 years if you are 61-30 years of age. ? Every year if you are 29 years old or older. Diabetes Have regular diabetes screenings. This checks your fasting blood sugar level. Have the screening done:  Once every three years after age 37 if you are at a normal weight and have a low risk for diabetes.  More often and at a younger age if you are overweight or have a high risk for diabetes. What should I know about preventing infection? Hepatitis B If you have a higher risk for hepatitis B, you should be screened for this virus. Talk with your health care provider to find out if you are at risk for hepatitis B infection. Hepatitis C Testing is recommended for:  Everyone born from [birth year] through 1967.  Anyone with known risk factors for hepatitis C. Sexually transmitted infections (STIs)  Get screened for STIs, including gonorrhea and chlamydia, if: ? You are sexually active and are younger than 45 years of age. ? You are older than 45 years of age and your health care provider tells you that you are at  risk for this type of infection. ? Your sexual activity has changed since you were last screened, and you are at increased risk for chlamydia or gonorrhea. Ask your health care provider if you are at risk.  Ask your health care provider about whether you are at high risk for HIV. Your health care provider may recommend a prescription medicine to help prevent HIV infection. If you choose to take medicine to prevent HIV, you should first  get tested for HIV. You should then be tested every 3 months for as long as you are taking the medicine. Pregnancy  If you are about to stop having your period (premenopausal) and you may become pregnant, seek counseling before you get pregnant.  Take 400 to 800 micrograms (mcg) of folic acid every day if you become pregnant.  Ask for birth control (contraception) if you want to prevent pregnancy. Osteoporosis and menopause Osteoporosis is a disease in which the bones lose minerals and strength with aging. This can result in bone fractures. If you are 72 years old or older, or if you are at risk for osteoporosis and fractures, ask your health care provider if you should:  Be screened for bone loss.  Take a calcium or vitamin D supplement to lower your risk of fractures.  Be given hormone replacement therapy (HRT) to treat symptoms of menopause. Follow these instructions at home: Lifestyle  Do not use any products that contain nicotine or tobacco, such as cigarettes, e-cigarettes, and chewing tobacco. If you need help quitting, ask your health care provider.  Do not use street drugs.  Do not share needles.  Ask your health care provider for help if you need support or information about quitting drugs. Alcohol use  Do not drink alcohol if: ? Your health care provider tells you not to drink. ? You are pregnant, may be pregnant, or are planning to become pregnant.  If you drink alcohol: ? Limit how much you use to 0-1 drink a day. ? Limit intake if you are breastfeeding.  Be aware of how much alcohol is in your drink. In the [place] one drink equals one 12 oz bottle of beer (355 mL), one 5 oz glass of wine (148 mL), or one 1 oz glass of hard liquor (44 mL). General instructions  Schedule regular health, dental, and eye exams.  Stay current with your vaccines.  Tell your health care provider if: ? You often feel depressed. ? You have ever been abused or do not feel safe at  home. Summary  Adopting a healthy lifestyle and getting preventive care are important in promoting health and wellness.  Follow your health care provider's instructions about healthy diet, exercising, and getting tested or screened for diseases.  Follow your health care provider's instructions on monitoring your cholesterol and blood pressure. This information is not intended to replace advice given to you by your health care provider. Make sure you discuss any questions you have with your health care provider. Document Released: 11/29/2010 Document Revised: 05/09/2018 Document Reviewed: 05/09/2018 Elsevier Patient Education  2020 [employer].

## 2019-05-02 LAB — CERVICOVAGINAL ANCILLARY ONLY
Bacterial Vaginitis (gardnerella): NEGATIVE
Candida Glabrata: NEGATIVE
Candida Vaginitis: NEGATIVE
Chlamydia: NEGATIVE
Comment: NEGATIVE
Comment: NEGATIVE
Comment: NEGATIVE
Comment: NEGATIVE
Comment: NEGATIVE
Comment: NORMAL
Neisseria Gonorrhea: NEGATIVE
Trichomonas: NEGATIVE

## 2019-05-02 LAB — BMP8+EGFR
BUN/Creatinine Ratio: 18 (ref 9–23)
BUN: 12 mg/dL (ref 6–24)
CO2: 23 mmol/L (ref 20–29)
Calcium: 9.6 mg/dL (ref 8.7–10.2)
Chloride: 101 mmol/L (ref 96–106)
Creatinine, Ser: 0.66 mg/dL (ref 0.57–1.00)
GFR calc Af Amer: 124 mL/min/{1.73_m2} (ref >59)
GFR calc non Af Amer: 108 mL/min/{1.73_m2} (ref >59)
Glucose: 97 mg/dL (ref 65–99)
Potassium: 4.7 mmol/L (ref 3.5–5.2)
Sodium: 137 mmol/L (ref 134–144)

## 2019-05-02 LAB — LIPID PANEL
Chol/HDL Ratio: 2.4 ratio (ref 0.0–4.4)
Cholesterol, Total: 165 mg/dL (ref 100–199)
HDL: 68 mg/dL (ref >39)
LDL Chol Calc (NIH): 77 mg/dL (ref 0–99)
Triglycerides: 113 mg/dL (ref 0–149)
VLDL Cholesterol Cal: 20 mg/dL (ref 5–40)

## 2019-05-02 LAB — VITAMIN D 25 HYDROXY (VIT D DEFICIENCY, FRACTURES): Vit D, 25-Hydroxy: 26.8 ng/mL — ABNORMAL LOW (ref 30.0–100.0)

## 2019-05-02 LAB — HEMOGLOBIN A1C
Est. average glucose Bld gHb Est-mCnc: 114 mg/dL
Hgb A1c MFr Bld: 5.6 % (ref 4.8–5.6)

## 2019-05-02 LAB — CYTOLOGY - PAP: Diagnosis: NEGATIVE

## 2019-05-02 LAB — HIV ANTIBODY (ROUTINE TESTING W REFLEX): HIV Screen 4th Generation wRfx: NONREACTIVE

## 2019-05-22 ENCOUNTER — Other Ambulatory Visit: Payer: Self-pay

## 2019-05-22 ENCOUNTER — Ambulatory Visit
Admission: RE | Admit: 2019-05-22 | Discharge: 2019-05-22 | Disposition: A | Discharge status: Home / Self Care | Payer: Managed Care, Other (non HMO) | Source: Ambulatory Visit | Attending: Nurse Practitioner | Admitting: Nurse Practitioner

## 2019-05-22 DIAGNOSIS — Z1231 Encounter for screening mammogram for malignant neoplasm of breast: Secondary | ICD-10-CM

## 2019-11-07 IMAGING — MG DIGITAL SCREENING BILATERAL MAMMOGRAM WITH TOMO AND CAD
5 series · 6 of 17 positions shown · non-contrast
Comparison: Previous exam(s).

CLINICAL DATA: Screening.

EXAM:
DIGITAL SCREENING BILATERAL MAMMOGRAM WITH TOMO AND CAD

[R MLO synth-2D]
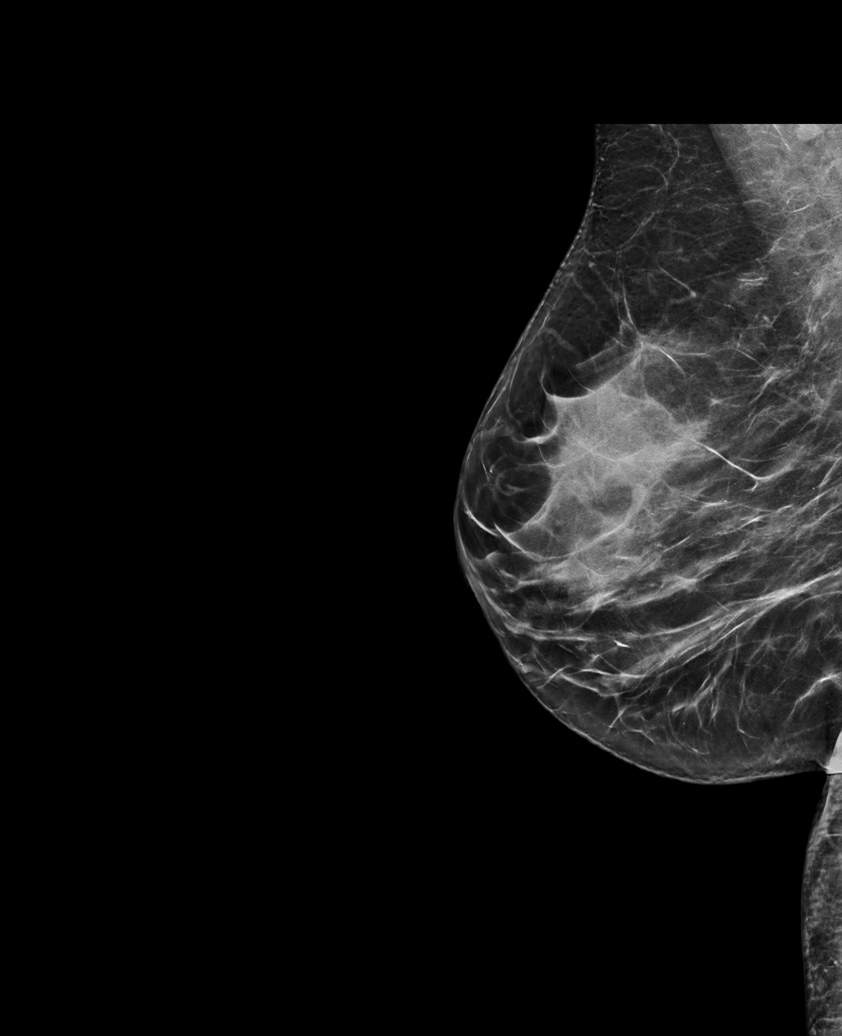

[R CC synth-2D]
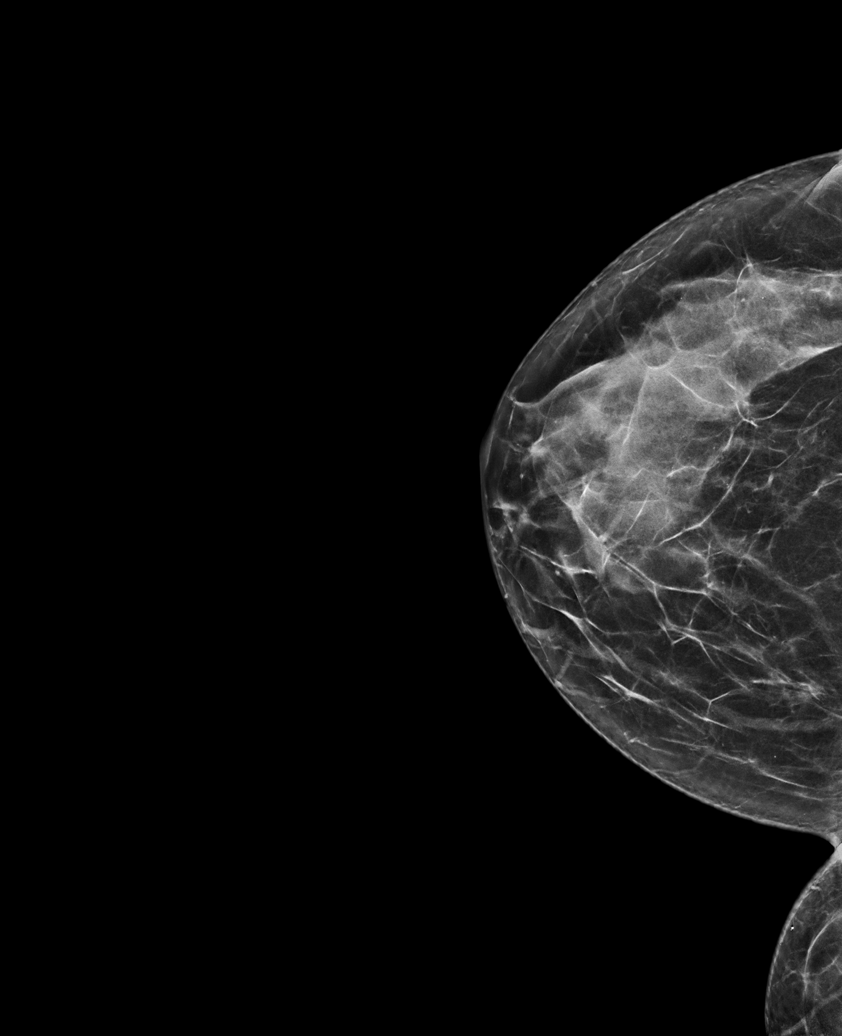

[L MLO tomo · 2 of 76 frames shown]
[frame 25/76]
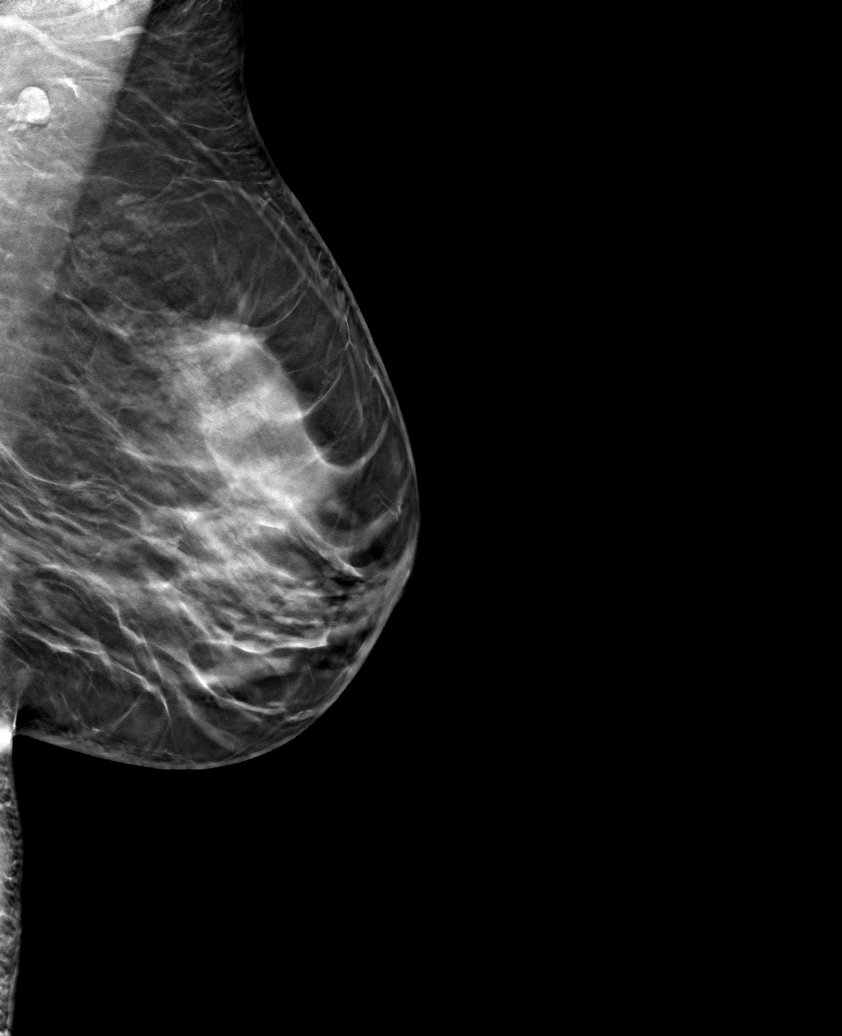
[frame 39/76]
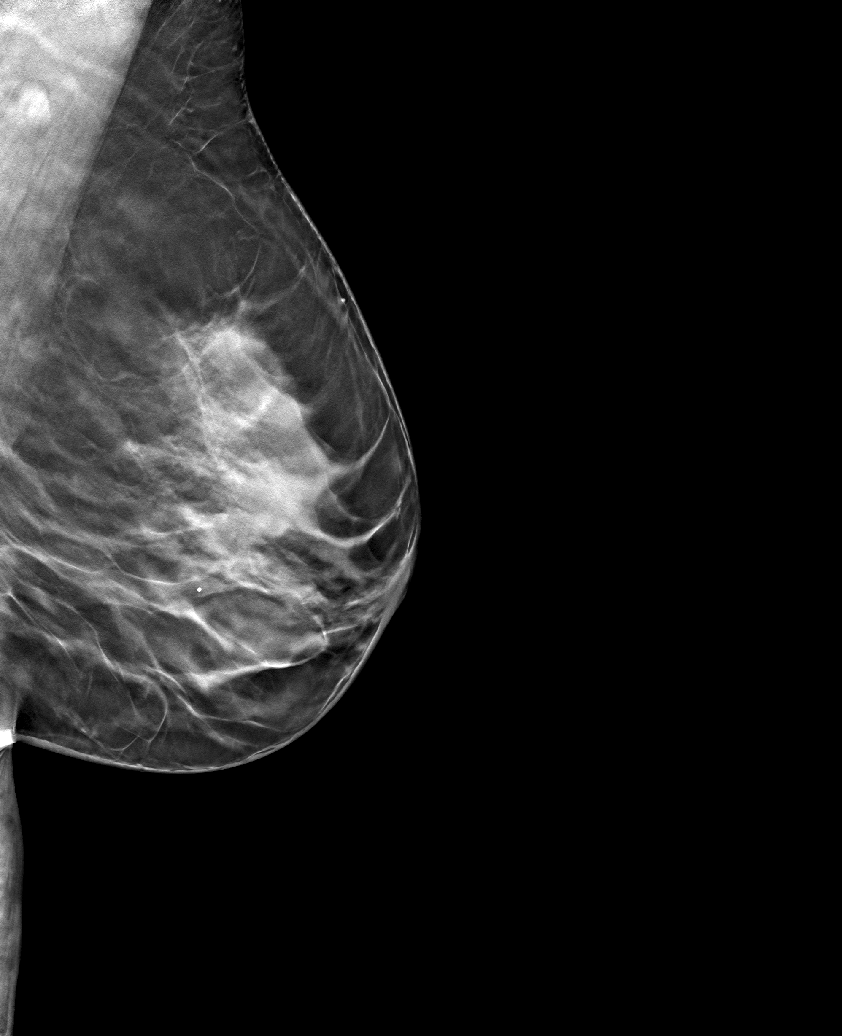

[R MLO tomo · tomo slice 37/73.0]
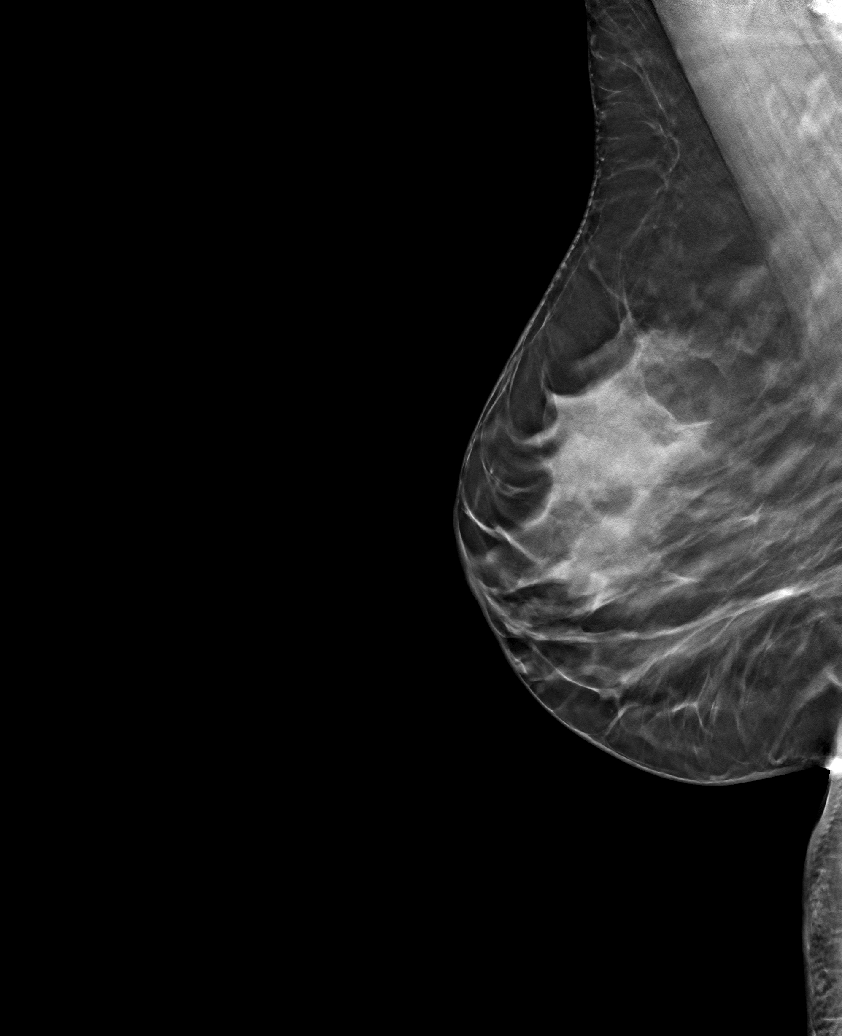

[R CC tomo · tomo slice 34/67.0]
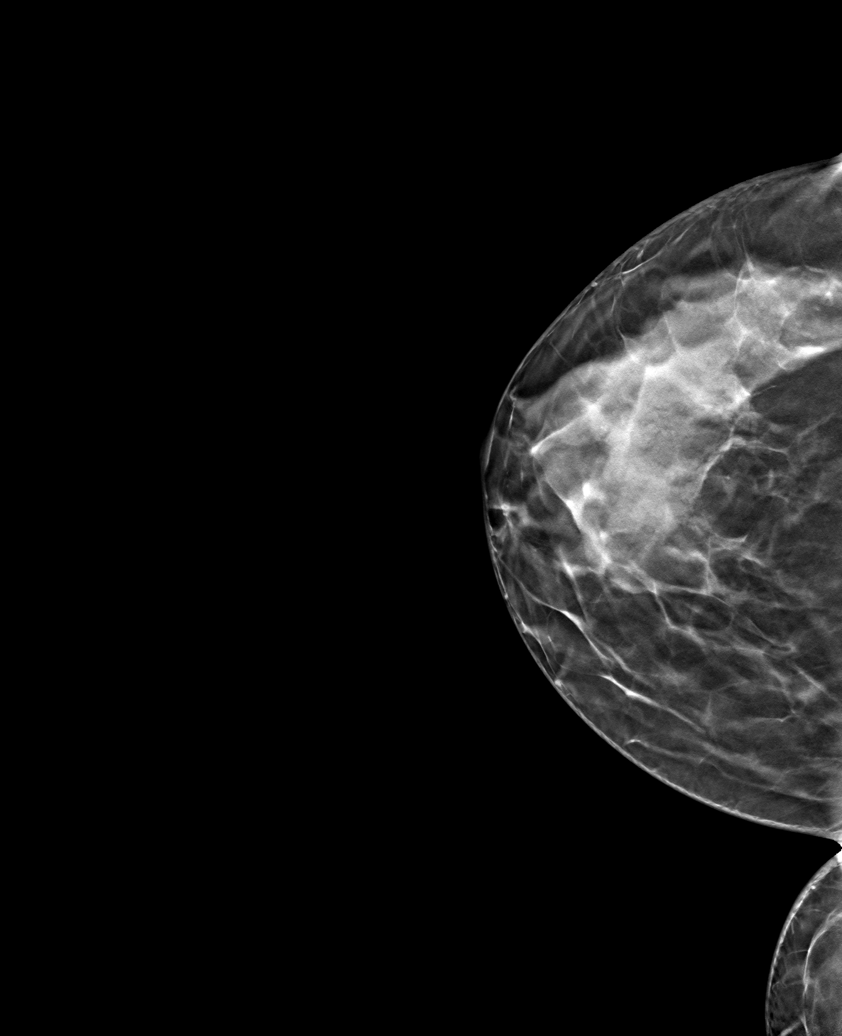

[6 of 17 positions shown; findings below may reference images not displayed]

ACR Breast Density Category c: The breast tissue is heterogeneously
dense, which may obscure small masses.
FINDINGS: There are no findings suspicious for malignancy. Images were
processed with CAD.
IMPRESSION: No mammographic evidence of malignancy. A result letter of this
screening mammogram will be mailed directly to the patient.

RECOMMENDATION:
Screening mammogram in one year. (Code:[id number])

BI-RADS CATEGORY  1: Negative.

## 2020-03-18 ENCOUNTER — Other Ambulatory Visit: Payer: Self-pay | Admitting: Nurse Practitioner

## 2020-03-18 DIAGNOSIS — Z3041 Encounter for surveillance of contraceptive pills: Secondary | ICD-10-CM

## 2020-05-04 ENCOUNTER — Encounter: Payer: Self-pay | Admitting: Nurse Practitioner

## 2020-05-06 ENCOUNTER — Encounter: Payer: Self-pay | Admitting: Nurse Practitioner

## 2020-08-20 DIAGNOSIS — Z3041 Encounter for surveillance of contraceptive pills: Secondary | ICD-10-CM | POA: Diagnosis not present

## 2020-11-24 DIAGNOSIS — L819 Disorder of pigmentation, unspecified: Secondary | ICD-10-CM | POA: Diagnosis not present

## 2020-11-24 DIAGNOSIS — B373 Candidiasis of vulva and vagina: Secondary | ICD-10-CM | POA: Diagnosis not present

## 2020-11-24 DIAGNOSIS — Z113 Encounter for screening for infections with a predominantly sexual mode of transmission: Secondary | ICD-10-CM | POA: Diagnosis not present

## 2023-08-14 ENCOUNTER — Telehealth: Payer: Self-pay

## 2023-08-14 NOTE — Telephone Encounter (Signed)
 Telephoned patient at mobile number. Left a voice message with [facility] (scholarship) contact information.

## 2023-09-04 ENCOUNTER — Other Ambulatory Visit: Payer: Self-pay | Admitting: Obstetrics & Gynecology

## 2023-09-04 DIAGNOSIS — Z1231 Encounter for screening mammogram for malignant neoplasm of breast: Secondary | ICD-10-CM

## 2023-09-14 ENCOUNTER — Other Ambulatory Visit: Payer: Self-pay | Admitting: Obstetrics & Gynecology

## 2023-09-14 ENCOUNTER — Ambulatory Visit
Admission: RE | Admit: 2023-09-14 | Discharge: 2023-09-14 | Disposition: A | Discharge status: Home / Self Care | Source: Ambulatory Visit | Attending: Obstetrics & Gynecology | Admitting: Obstetrics & Gynecology

## 2023-09-14 DIAGNOSIS — Z1231 Encounter for screening mammogram for malignant neoplasm of breast: Secondary | ICD-10-CM

## 2023-09-21 ENCOUNTER — Encounter: Payer: Self-pay | Admitting: Obstetrics & Gynecology
